# Patient Record
Sex: Female | Born: 1955 | Race: White | Hispanic: No | Marital: Single
Health system: Southern US, Community
[De-identification: ages and names within clinical notes are randomized; demographics above are authoritative.]

---

## 2014-06-30 ENCOUNTER — Inpatient Hospital Stay
Admission: AD | Admit: 2014-06-30 | Discharge: 2014-07-22 | Disposition: A | Discharge status: Skilled Nursing Facility | Payer: Self-pay | Source: Ambulatory Visit | Attending: Internal Medicine | Admitting: Internal Medicine

## 2014-06-30 ENCOUNTER — Other Ambulatory Visit (HOSPITAL_COMMUNITY): Payer: Self-pay

## 2014-07-01 ENCOUNTER — Other Ambulatory Visit (HOSPITAL_COMMUNITY): Payer: Self-pay

## 2014-07-01 LAB — CBC WITH DIFFERENTIAL/PLATELET
BASOS ABS: 0 10*3/uL (ref 0.0–0.1)
Basophils Relative: 0 % (ref 0–1)
Eosinophils Absolute: 0.5 10*3/uL (ref 0.0–0.7)
Eosinophils Relative: 5 % (ref 0–5)
HCT: 28.4 % — ABNORMAL LOW (ref 36.0–46.0)
Hemoglobin: 8.5 g/dL — ABNORMAL LOW (ref 12.0–15.0)
Lymphocytes Relative: 10 % — ABNORMAL LOW (ref 12–46)
Lymphs Abs: 1.2 10*3/uL (ref 0.7–4.0)
MCH: 29.2 pg (ref 26.0–34.0)
MCHC: 29.9 g/dL — ABNORMAL LOW (ref 30.0–36.0)
MCV: 97.6 fL (ref 78.0–100.0)
Monocytes Absolute: 0.6 10*3/uL (ref 0.1–1.0)
Monocytes Relative: 5 % (ref 3–12)
NEUTROS ABS: 9.1 10*3/uL — AB (ref 1.7–7.7)
Neutrophils Relative %: 80 % — ABNORMAL HIGH (ref 43–77)
Platelets: 429 10*3/uL — ABNORMAL HIGH (ref 150–400)
RBC: 2.91 MIL/uL — ABNORMAL LOW (ref 3.87–5.11)
RDW: 15.9 % — AB (ref 11.5–15.5)
WBC: 11.4 10*3/uL — AB (ref 4.0–10.5)

## 2014-07-01 LAB — PREALBUMIN: Prealbumin: 12.2 mg/dL — ABNORMAL LOW (ref 17.0–34.0)

## 2014-07-01 LAB — FERRITIN: Ferritin: 234 ng/mL (ref 10–291)

## 2014-07-01 LAB — COMPREHENSIVE METABOLIC PANEL
ALT: 17 U/L (ref 0–35)
AST: 20 U/L (ref 0–37)
Albumin: 2.2 g/dL — ABNORMAL LOW (ref 3.5–5.2)
Alkaline Phosphatase: 152 U/L — ABNORMAL HIGH (ref 39–117)
Anion gap: 13 (ref 5–15)
BILIRUBIN TOTAL: 0.3 mg/dL (ref 0.3–1.2)
BUN: 16 mg/dL (ref 6–23)
CO2: 27 mEq/L (ref 19–32)
Calcium: 8.6 mg/dL (ref 8.4–10.5)
Chloride: 101 mEq/L (ref 96–112)
Creatinine, Ser: 0.27 mg/dL — ABNORMAL LOW (ref 0.50–1.10)
GFR calc Af Amer: >90 mL/min (ref >90)
Glucose, Bld: 114 mg/dL — ABNORMAL HIGH (ref 70–99)
Potassium: 3.9 mEq/L (ref 3.7–5.3)
Sodium: 141 mEq/L (ref 137–147)
Total Protein: 7.2 g/dL (ref 6.0–8.3)

## 2014-07-01 LAB — PROCALCITONIN: Procalcitonin: <0.1 ng/mL

## 2014-07-01 LAB — TSH: TSH: 3.1 u[IU]/mL (ref 0.350–4.500)

## 2014-07-01 LAB — LIPID PANEL
CHOL/HDL RATIO: 4.8 ratio
CHOLESTEROL: 140 mg/dL (ref 0–200)
HDL: 29 mg/dL — ABNORMAL LOW (ref >39)
LDL Cholesterol: 89 mg/dL (ref 0–99)
Triglycerides: 110 mg/dL (ref <150)
VLDL: 22 mg/dL (ref 0–40)

## 2014-07-01 LAB — VANCOMYCIN, TROUGH: Vancomycin Tr: 6.5 ug/mL — ABNORMAL LOW (ref 10.0–20.0)

## 2014-07-01 LAB — PHOSPHORUS: Phosphorus: 3.4 mg/dL (ref 2.3–4.6)

## 2014-07-01 LAB — MAGNESIUM: Magnesium: 1.9 mg/dL (ref 1.5–2.5)

## 2014-07-01 LAB — PROTIME-INR
INR: 1.09 (ref 0.00–1.49)
Prothrombin Time: 14.1 seconds (ref 11.6–15.2)

## 2014-07-01 LAB — HEMOGLOBIN A1C
HEMOGLOBIN A1C: 4.7 % (ref <5.7)
Mean Plasma Glucose: 88 mg/dL (ref <117)

## 2014-07-01 LAB — VITAMIN B12: Vitamin B-12: 438 pg/mL (ref 211–911)

## 2014-07-01 LAB — T4, FREE: FREE T4: 1.14 ng/dL (ref 0.80–1.80)

## 2014-07-01 MED ORDER — IOHEXOL 300 MG/ML  SOLN
100.0000 mL | Freq: Once | INTRAMUSCULAR | Status: AC | PRN
  Administered 2014-07-01: 100 mL via INTRAVENOUS

## 2014-07-01 MED ORDER — IOHEXOL 300 MG/ML  SOLN: 50.0000 mL | INTRAMUSCULAR | Status: AC

## 2014-07-02 LAB — BASIC METABOLIC PANEL
Anion gap: 15 (ref 5–15)
BUN: 13 mg/dL (ref 6–23)
CALCIUM: 9.2 mg/dL (ref 8.4–10.5)
CO2: 27 mEq/L (ref 19–32)
Chloride: 99 mEq/L (ref 96–112)
Creatinine, Ser: 0.3 mg/dL — ABNORMAL LOW (ref 0.50–1.10)
GFR calc Af Amer: >90 mL/min (ref >90)
Glucose, Bld: 108 mg/dL — ABNORMAL HIGH (ref 70–99)
Potassium: 4.1 mEq/L (ref 3.7–5.3)
SODIUM: 141 meq/L (ref 137–147)

## 2014-07-02 LAB — CBC WITH DIFFERENTIAL/PLATELET
Basophils Absolute: 0.1 10*3/uL (ref 0.0–0.1)
Basophils Relative: 0 % (ref 0–1)
EOS ABS: 0.4 10*3/uL (ref 0.0–0.7)
EOS PCT: 4 % (ref 0–5)
HCT: 32.4 % — ABNORMAL LOW (ref 36.0–46.0)
Hemoglobin: 10 g/dL — ABNORMAL LOW (ref 12.0–15.0)
Lymphocytes Relative: 14 % (ref 12–46)
Lymphs Abs: 1.6 10*3/uL (ref 0.7–4.0)
MCH: 29.6 pg (ref 26.0–34.0)
MCHC: 30.9 g/dL (ref 30.0–36.0)
MCV: 95.9 fL (ref 78.0–100.0)
MONOS PCT: 8 % (ref 3–12)
Monocytes Absolute: 0.9 10*3/uL (ref 0.1–1.0)
NEUTROS PCT: 74 % (ref 43–77)
Neutro Abs: 8.5 10*3/uL — ABNORMAL HIGH (ref 1.7–7.7)
Platelets: 444 10*3/uL — ABNORMAL HIGH (ref 150–400)
RBC: 3.38 MIL/uL — ABNORMAL LOW (ref 3.87–5.11)
RDW: 15.9 % — ABNORMAL HIGH (ref 11.5–15.5)
WBC: 11.4 10*3/uL — ABNORMAL HIGH (ref 4.0–10.5)

## 2014-07-02 LAB — FOLATE RBC: RBC Folate: 787 ng/mL — ABNORMAL HIGH (ref >280)

## 2014-07-04 ENCOUNTER — Other Ambulatory Visit (HOSPITAL_COMMUNITY): Payer: Self-pay

## 2014-07-04 LAB — URINE CULTURE: Colony Count: >=100000

## 2014-07-04 LAB — BASIC METABOLIC PANEL
Anion gap: 12 (ref 5–15)
BUN: 15 mg/dL (ref 6–23)
CHLORIDE: 99 meq/L (ref 96–112)
CO2: 26 meq/L (ref 19–32)
CREATININE: 0.27 mg/dL — AB (ref 0.50–1.10)
Calcium: 9.1 mg/dL (ref 8.4–10.5)
GFR calc Af Amer: >90 mL/min (ref >90)
GFR calc non Af Amer: >90 mL/min (ref >90)
Glucose, Bld: 104 mg/dL — ABNORMAL HIGH (ref 70–99)
POTASSIUM: 4.5 meq/L (ref 3.7–5.3)
Sodium: 137 mEq/L (ref 137–147)

## 2014-07-04 LAB — COMPREHENSIVE METABOLIC PANEL
ALBUMIN: 2.3 g/dL — AB (ref 3.5–5.2)
ALT: 16 U/L (ref 0–35)
AST: 17 U/L (ref 0–37)
Alkaline Phosphatase: 134 U/L — ABNORMAL HIGH (ref 39–117)
Anion gap: 7 (ref 5–15)
BUN: 15 mg/dL (ref 6–23)
CO2: 24 meq/L (ref 19–32)
CREATININE: 0.23 mg/dL — AB (ref 0.50–1.10)
Calcium: 8.1 mg/dL — ABNORMAL LOW (ref 8.4–10.5)
Chloride: 97 mEq/L (ref 96–112)
GFR calc Af Amer: >90 mL/min (ref >90)
Glucose, Bld: 400 mg/dL — ABNORMAL HIGH (ref 70–99)
Potassium: 3.8 mEq/L (ref 3.7–5.3)
Sodium: 128 mEq/L — ABNORMAL LOW (ref 137–147)
TOTAL PROTEIN: 7.4 g/dL (ref 6.0–8.3)
Total Bilirubin: 0.2 mg/dL — ABNORMAL LOW (ref 0.3–1.2)

## 2014-07-04 LAB — CBC WITH DIFFERENTIAL/PLATELET
BASOS ABS: 0 10*3/uL (ref 0.0–0.1)
BASOS PCT: 0 % (ref 0–1)
EOS ABS: 0.5 10*3/uL (ref 0.0–0.7)
EOS PCT: 5 % (ref 0–5)
HCT: 26.9 % — ABNORMAL LOW (ref 36.0–46.0)
Hemoglobin: 8 g/dL — ABNORMAL LOW (ref 12.0–15.0)
LYMPHS ABS: 1.7 10*3/uL (ref 0.7–4.0)
Lymphocytes Relative: 17 % (ref 12–46)
MCH: 29.3 pg (ref 26.0–34.0)
MCHC: 29.7 g/dL — ABNORMAL LOW (ref 30.0–36.0)
MCV: 98.5 fL (ref 78.0–100.0)
Monocytes Absolute: 0.7 10*3/uL (ref 0.1–1.0)
Monocytes Relative: 7 % (ref 3–12)
NEUTROS PCT: 71 % (ref 43–77)
Neutro Abs: 7.1 10*3/uL (ref 1.7–7.7)
PLATELETS: 350 10*3/uL (ref 150–400)
RBC: 2.73 MIL/uL — ABNORMAL LOW (ref 3.87–5.11)
RDW: 16.1 % — AB (ref 11.5–15.5)
WBC: 10 10*3/uL (ref 4.0–10.5)

## 2014-07-04 LAB — BODY FLUID CELL COUNT WITH DIFFERENTIAL
Eos, Fluid: 1 %
LYMPHS FL: 93 %
Monocyte-Macrophage-Serous Fluid: 2 % — ABNORMAL LOW (ref 50–90)
NEUTROPHIL FLUID: 4 % (ref 0–25)
WBC FLUID: 1644 uL — AB (ref 0–1000)

## 2014-07-04 LAB — PREALBUMIN: Prealbumin: 15.9 mg/dL — ABNORMAL LOW (ref 17.0–34.0)

## 2014-07-04 LAB — LACTATE DEHYDROGENASE, PLEURAL OR PERITONEAL FLUID: LD FL: 223 U/L — AB (ref 3–23)

## 2014-07-04 NOTE — Procedures (Addendum)
US guided L thora 600 cc blood tinged fluid Sent for labs  cxr pending

## 2014-07-05 ENCOUNTER — Other Ambulatory Visit (HOSPITAL_COMMUNITY): Payer: Self-pay

## 2014-07-06 LAB — BASIC METABOLIC PANEL
Anion gap: 18 — ABNORMAL HIGH (ref 5–15)
BUN: 18 mg/dL (ref 6–23)
CHLORIDE: 96 meq/L (ref 96–112)
CO2: 22 mEq/L (ref 19–32)
CREATININE: 0.34 mg/dL — AB (ref 0.50–1.10)
Calcium: 9.5 mg/dL (ref 8.4–10.5)
GFR calc Af Amer: >90 mL/min (ref >90)
GFR calc non Af Amer: >90 mL/min (ref >90)
GLUCOSE: 118 mg/dL — AB (ref 70–99)
Potassium: 4.2 mEq/L (ref 3.7–5.3)
Sodium: 136 mEq/L — ABNORMAL LOW (ref 137–147)

## 2014-07-06 LAB — CBC
HEMATOCRIT: 33.2 % — AB (ref 36.0–46.0)
HEMOGLOBIN: 10.4 g/dL — AB (ref 12.0–15.0)
MCH: 29.5 pg (ref 26.0–34.0)
MCHC: 31.3 g/dL (ref 30.0–36.0)
MCV: 94.3 fL (ref 78.0–100.0)
Platelets: 474 10*3/uL — ABNORMAL HIGH (ref 150–400)
RBC: 3.52 MIL/uL — ABNORMAL LOW (ref 3.87–5.11)
RDW: 15.5 % (ref 11.5–15.5)
WBC: 13.5 10*3/uL — AB (ref 4.0–10.5)

## 2014-07-06 LAB — CLOSTRIDIUM DIFFICILE BY PCR: Toxigenic C. Difficile by PCR: NEGATIVE

## 2014-07-07 LAB — CBC WITH DIFFERENTIAL/PLATELET
Basophils Absolute: 0.1 10*3/uL (ref 0.0–0.1)
Basophils Relative: 1 % (ref 0–1)
EOS ABS: 0.6 10*3/uL (ref 0.0–0.7)
Eosinophils Relative: 4 % (ref 0–5)
HEMATOCRIT: 35.3 % — AB (ref 36.0–46.0)
Hemoglobin: 10.8 g/dL — ABNORMAL LOW (ref 12.0–15.0)
LYMPHS ABS: 3 10*3/uL (ref 0.7–4.0)
LYMPHS PCT: 21 % (ref 12–46)
MCH: 28.8 pg (ref 26.0–34.0)
MCHC: 30.6 g/dL (ref 30.0–36.0)
MCV: 94.1 fL (ref 78.0–100.0)
MONO ABS: 1.2 10*3/uL — AB (ref 0.1–1.0)
MONOS PCT: 8 % (ref 3–12)
Neutro Abs: 9.7 10*3/uL — ABNORMAL HIGH (ref 1.7–7.7)
Neutrophils Relative %: 66 % (ref 43–77)
PLATELETS: 467 10*3/uL — AB (ref 150–400)
RBC: 3.75 MIL/uL — AB (ref 3.87–5.11)
RDW: 15.3 % (ref 11.5–15.5)
WBC: 14.5 10*3/uL — AB (ref 4.0–10.5)

## 2014-07-08 LAB — BASIC METABOLIC PANEL
ANION GAP: 15 (ref 5–15)
BUN: 24 mg/dL — AB (ref 6–23)
CALCIUM: 9.8 mg/dL (ref 8.4–10.5)
CO2: 24 mEq/L (ref 19–32)
CREATININE: 0.39 mg/dL — AB (ref 0.50–1.10)
Chloride: 98 mEq/L (ref 96–112)
GFR calc non Af Amer: >90 mL/min (ref >90)
Glucose, Bld: 92 mg/dL (ref 70–99)
Potassium: 4.8 mEq/L (ref 3.7–5.3)
Sodium: 137 mEq/L (ref 137–147)

## 2014-07-08 LAB — CBC
HEMATOCRIT: 41.3 % (ref 36.0–46.0)
Hemoglobin: 12.9 g/dL (ref 12.0–15.0)
MCH: 30 pg (ref 26.0–34.0)
MCHC: 31.2 g/dL (ref 30.0–36.0)
MCV: 96 fL (ref 78.0–100.0)
PLATELETS: 471 10*3/uL — AB (ref 150–400)
RBC: 4.3 MIL/uL (ref 3.87–5.11)
RDW: 15.4 % (ref 11.5–15.5)
WBC: 13.4 10*3/uL — AB (ref 4.0–10.5)

## 2014-07-08 LAB — PROCALCITONIN: Procalcitonin: 0.11 ng/mL

## 2014-07-10 ENCOUNTER — Other Ambulatory Visit (HOSPITAL_COMMUNITY): Payer: Self-pay

## 2014-07-10 LAB — BASIC METABOLIC PANEL
Anion gap: 17 — ABNORMAL HIGH (ref 5–15)
BUN: 28 mg/dL — AB (ref 6–23)
CALCIUM: 9.5 mg/dL (ref 8.4–10.5)
CO2: 21 mEq/L (ref 19–32)
Chloride: 97 mEq/L (ref 96–112)
Creatinine, Ser: 0.27 mg/dL — ABNORMAL LOW (ref 0.50–1.10)
GFR calc non Af Amer: >90 mL/min (ref >90)
GLUCOSE: 130 mg/dL — AB (ref 70–99)
POTASSIUM: 5.3 meq/L (ref 3.7–5.3)
SODIUM: 135 meq/L — AB (ref 137–147)

## 2014-07-10 LAB — CBC WITH DIFFERENTIAL/PLATELET
Basophils Absolute: 0.1 10*3/uL (ref 0.0–0.1)
Basophils Relative: 0 % (ref 0–1)
EOS ABS: 0.4 10*3/uL (ref 0.0–0.7)
EOS PCT: 2 % (ref 0–5)
HCT: 34.7 % — ABNORMAL LOW (ref 36.0–46.0)
Hemoglobin: 10.9 g/dL — ABNORMAL LOW (ref 12.0–15.0)
LYMPHS ABS: 2.7 10*3/uL (ref 0.7–4.0)
Lymphocytes Relative: 19 % (ref 12–46)
MCH: 28.8 pg (ref 26.0–34.0)
MCHC: 31.4 g/dL (ref 30.0–36.0)
MCV: 91.8 fL (ref 78.0–100.0)
MONOS PCT: 9 % (ref 3–12)
Monocytes Absolute: 1.3 10*3/uL — ABNORMAL HIGH (ref 0.1–1.0)
NEUTROS PCT: 70 % (ref 43–77)
Neutro Abs: 10 10*3/uL — ABNORMAL HIGH (ref 1.7–7.7)
PLATELETS: 494 10*3/uL — AB (ref 150–400)
RBC: 3.78 MIL/uL — ABNORMAL LOW (ref 3.87–5.11)
RDW: 14.9 % (ref 11.5–15.5)
WBC: 14.4 10*3/uL — ABNORMAL HIGH (ref 4.0–10.5)

## 2014-07-10 LAB — PHOSPHORUS: Phosphorus: 3.9 mg/dL (ref 2.3–4.6)

## 2014-07-10 LAB — MAGNESIUM: Magnesium: 1.9 mg/dL (ref 1.5–2.5)

## 2014-07-11 LAB — COMPREHENSIVE METABOLIC PANEL
ALBUMIN: 3 g/dL — AB (ref 3.5–5.2)
ALT: 22 U/L (ref 0–35)
AST: 21 U/L (ref 0–37)
Alkaline Phosphatase: 134 U/L — ABNORMAL HIGH (ref 39–117)
Anion gap: 16 — ABNORMAL HIGH (ref 5–15)
BILIRUBIN TOTAL: 0.2 mg/dL — AB (ref 0.3–1.2)
BUN: 25 mg/dL — AB (ref 6–23)
CALCIUM: 9.6 mg/dL (ref 8.4–10.5)
CO2: 22 mEq/L (ref 19–32)
CREATININE: 0.32 mg/dL — AB (ref 0.50–1.10)
Chloride: 98 mEq/L (ref 96–112)
GFR calc Af Amer: >90 mL/min (ref >90)
GFR calc non Af Amer: >90 mL/min (ref >90)
Glucose, Bld: 117 mg/dL — ABNORMAL HIGH (ref 70–99)
Potassium: 4.5 mEq/L (ref 3.7–5.3)
Sodium: 136 mEq/L — ABNORMAL LOW (ref 137–147)
Total Protein: 8.2 g/dL (ref 6.0–8.3)

## 2014-07-11 LAB — CBC WITH DIFFERENTIAL/PLATELET
BASOS ABS: 0.1 10*3/uL (ref 0.0–0.1)
Basophils Relative: 0 % (ref 0–1)
EOS ABS: 0.5 10*3/uL (ref 0.0–0.7)
EOS PCT: 4 % (ref 0–5)
HEMATOCRIT: 33.8 % — AB (ref 36.0–46.0)
HEMOGLOBIN: 10.8 g/dL — AB (ref 12.0–15.0)
Lymphocytes Relative: 24 % (ref 12–46)
Lymphs Abs: 3 10*3/uL (ref 0.7–4.0)
MCH: 29.3 pg (ref 26.0–34.0)
MCHC: 32 g/dL (ref 30.0–36.0)
MCV: 91.6 fL (ref 78.0–100.0)
MONOS PCT: 10 % (ref 3–12)
Monocytes Absolute: 1.2 10*3/uL — ABNORMAL HIGH (ref 0.1–1.0)
Neutro Abs: 7.7 10*3/uL (ref 1.7–7.7)
Neutrophils Relative %: 62 % (ref 43–77)
Platelets: 500 10*3/uL — ABNORMAL HIGH (ref 150–400)
RBC: 3.69 MIL/uL — ABNORMAL LOW (ref 3.87–5.11)
RDW: 14.7 % (ref 11.5–15.5)
WBC: 12.5 10*3/uL — ABNORMAL HIGH (ref 4.0–10.5)

## 2014-07-11 LAB — PREALBUMIN: PREALBUMIN: 23.4 mg/dL (ref 17.0–34.0)

## 2014-07-13 LAB — CBC WITH DIFFERENTIAL/PLATELET
BASOS ABS: 0.1 10*3/uL (ref 0.0–0.1)
Basophils Relative: 1 % (ref 0–1)
Eosinophils Absolute: 0.3 10*3/uL (ref 0.0–0.7)
Eosinophils Relative: 3 % (ref 0–5)
HCT: 38.5 % (ref 36.0–46.0)
HEMOGLOBIN: 12.4 g/dL (ref 12.0–15.0)
LYMPHS PCT: 31 % (ref 12–46)
Lymphs Abs: 2.9 10*3/uL (ref 0.7–4.0)
MCH: 29.7 pg (ref 26.0–34.0)
MCHC: 32.2 g/dL (ref 30.0–36.0)
MCV: 92.1 fL (ref 78.0–100.0)
MONOS PCT: 8 % (ref 3–12)
Monocytes Absolute: 0.7 10*3/uL (ref 0.1–1.0)
NEUTROS ABS: 5.3 10*3/uL (ref 1.7–7.7)
NEUTROS PCT: 57 % (ref 43–77)
Platelets: 434 10*3/uL — ABNORMAL HIGH (ref 150–400)
RBC: 4.18 MIL/uL (ref 3.87–5.11)
RDW: 14.6 % (ref 11.5–15.5)
WBC: 9.3 10*3/uL (ref 4.0–10.5)

## 2014-07-13 LAB — BASIC METABOLIC PANEL
ANION GAP: 16 — AB (ref 5–15)
BUN: 19 mg/dL (ref 6–23)
CHLORIDE: 98 meq/L (ref 96–112)
CO2: 22 mEq/L (ref 19–32)
Calcium: 10.1 mg/dL (ref 8.4–10.5)
Creatinine, Ser: 0.4 mg/dL — ABNORMAL LOW (ref 0.50–1.10)
GFR calc non Af Amer: >90 mL/min (ref >90)
Glucose, Bld: 100 mg/dL — ABNORMAL HIGH (ref 70–99)
Potassium: 3.8 mEq/L (ref 3.7–5.3)
Sodium: 136 mEq/L — ABNORMAL LOW (ref 137–147)

## 2014-07-13 LAB — PHOSPHORUS: Phosphorus: 4.5 mg/dL (ref 2.3–4.6)

## 2014-07-13 LAB — MAGNESIUM: Magnesium: 1.9 mg/dL (ref 1.5–2.5)

## 2014-07-14 ENCOUNTER — Other Ambulatory Visit (HOSPITAL_COMMUNITY): Payer: Self-pay

## 2014-07-15 LAB — CBC WITH DIFFERENTIAL/PLATELET
BASOS ABS: 0 10*3/uL (ref 0.0–0.1)
BASOS PCT: 0 % (ref 0–1)
Eosinophils Absolute: 0.2 10*3/uL (ref 0.0–0.7)
Eosinophils Relative: 2 % (ref 0–5)
HEMATOCRIT: 35.3 % — AB (ref 36.0–46.0)
HEMOGLOBIN: 11.1 g/dL — AB (ref 12.0–15.0)
Lymphocytes Relative: 30 % (ref 12–46)
Lymphs Abs: 2.8 10*3/uL (ref 0.7–4.0)
MCH: 28.5 pg (ref 26.0–34.0)
MCHC: 31.4 g/dL (ref 30.0–36.0)
MCV: 90.7 fL (ref 78.0–100.0)
MONO ABS: 0.9 10*3/uL (ref 0.1–1.0)
MONOS PCT: 10 % (ref 3–12)
NEUTROS PCT: 58 % (ref 43–77)
Neutro Abs: 5.4 10*3/uL (ref 1.7–7.7)
Platelets: 441 10*3/uL — ABNORMAL HIGH (ref 150–400)
RBC: 3.89 MIL/uL (ref 3.87–5.11)
RDW: 14.5 % (ref 11.5–15.5)
WBC: 9.2 10*3/uL (ref 4.0–10.5)

## 2014-07-15 LAB — BASIC METABOLIC PANEL
ANION GAP: 15 (ref 5–15)
BUN: 18 mg/dL (ref 6–23)
CO2: 24 meq/L (ref 19–32)
Calcium: 9.6 mg/dL (ref 8.4–10.5)
Chloride: 97 mEq/L (ref 96–112)
Creatinine, Ser: 0.5 mg/dL (ref 0.50–1.10)
GFR calc non Af Amer: >90 mL/min (ref >90)
Glucose, Bld: 99 mg/dL (ref 70–99)
POTASSIUM: 3.8 meq/L (ref 3.7–5.3)
Sodium: 136 mEq/L — ABNORMAL LOW (ref 137–147)

## 2014-07-15 LAB — PHOSPHORUS: PHOSPHORUS: 4.2 mg/dL (ref 2.3–4.6)

## 2014-07-15 LAB — MAGNESIUM: Magnesium: 1.8 mg/dL (ref 1.5–2.5)

## 2014-07-16 LAB — BASIC METABOLIC PANEL
ANION GAP: 16 — AB (ref 5–15)
BUN: 13 mg/dL (ref 6–23)
CALCIUM: 9.4 mg/dL (ref 8.4–10.5)
CO2: 21 mEq/L (ref 19–32)
Chloride: 101 mEq/L (ref 96–112)
Creatinine, Ser: 0.4 mg/dL — ABNORMAL LOW (ref 0.50–1.10)
GFR calc non Af Amer: >90 mL/min (ref >90)
Glucose, Bld: 91 mg/dL (ref 70–99)
POTASSIUM: 3.9 meq/L (ref 3.7–5.3)
SODIUM: 138 meq/L (ref 137–147)

## 2014-07-18 LAB — BASIC METABOLIC PANEL
ANION GAP: 12 (ref 5–15)
BUN: 17 mg/dL (ref 6–23)
CHLORIDE: 98 meq/L (ref 96–112)
CO2: 25 mEq/L (ref 19–32)
Calcium: 9.7 mg/dL (ref 8.4–10.5)
Creatinine, Ser: 0.5 mg/dL (ref 0.50–1.10)
GFR calc non Af Amer: >90 mL/min (ref >90)
Glucose, Bld: 97 mg/dL (ref 70–99)
Potassium: 3.9 mEq/L (ref 3.7–5.3)
SODIUM: 135 meq/L — AB (ref 137–147)

## 2014-07-18 LAB — CBC
HCT: 35.9 % — ABNORMAL LOW (ref 36.0–46.0)
Hemoglobin: 11.5 g/dL — ABNORMAL LOW (ref 12.0–15.0)
MCH: 28.8 pg (ref 26.0–34.0)
MCHC: 32 g/dL (ref 30.0–36.0)
MCV: 90 fL (ref 78.0–100.0)
PLATELETS: 401 10*3/uL — AB (ref 150–400)
RBC: 3.99 MIL/uL (ref 3.87–5.11)
RDW: 14.3 % (ref 11.5–15.5)
WBC: 7.7 10*3/uL (ref 4.0–10.5)

## 2014-07-18 LAB — PREALBUMIN: PREALBUMIN: 18.2 mg/dL (ref 17.0–34.0)

## 2014-07-19 LAB — PREALBUMIN: Prealbumin: 16.8 mg/dL — ABNORMAL LOW (ref 17.0–34.0)

## 2014-07-20 ENCOUNTER — Other Ambulatory Visit (HOSPITAL_COMMUNITY): Payer: Medicare Other

## 2014-07-21 LAB — CBC
HCT: 35.5 % — ABNORMAL LOW (ref 36.0–46.0)
Hemoglobin: 11.4 g/dL — ABNORMAL LOW (ref 12.0–15.0)
MCH: 28.9 pg (ref 26.0–34.0)
MCHC: 32.1 g/dL (ref 30.0–36.0)
MCV: 89.9 fL (ref 78.0–100.0)
PLATELETS: 376 10*3/uL (ref 150–400)
RBC: 3.95 MIL/uL (ref 3.87–5.11)
RDW: 14.1 % (ref 11.5–15.5)
WBC: 7.5 10*3/uL (ref 4.0–10.5)

## 2014-07-21 LAB — URINALYSIS, ROUTINE W REFLEX MICROSCOPIC
Glucose, UA: NEGATIVE mg/dL
Hgb urine dipstick: NEGATIVE
KETONES UR: NEGATIVE mg/dL
NITRITE: NEGATIVE
Protein, ur: NEGATIVE mg/dL
Specific Gravity, Urine: 1.025 (ref 1.005–1.030)
Urobilinogen, UA: 1 mg/dL (ref 0.0–1.0)
pH: 6 (ref 5.0–8.0)

## 2014-07-21 LAB — URINE MICROSCOPIC-ADD ON

## 2014-07-21 LAB — BASIC METABOLIC PANEL
Anion gap: 16 — ABNORMAL HIGH (ref 5–15)
BUN: 16 mg/dL (ref 6–23)
CALCIUM: 9.4 mg/dL (ref 8.4–10.5)
CO2: 21 mEq/L (ref 19–32)
Chloride: 102 mEq/L (ref 96–112)
Creatinine, Ser: 0.48 mg/dL — ABNORMAL LOW (ref 0.50–1.10)
GFR calc non Af Amer: >90 mL/min (ref >90)
GLUCOSE: 124 mg/dL — AB (ref 70–99)
Potassium: 3.5 mEq/L — ABNORMAL LOW (ref 3.7–5.3)
SODIUM: 139 meq/L (ref 137–147)

## 2014-07-24 LAB — URINE CULTURE: Colony Count: >=100000
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
## 2015-07-20 IMAGING — CR DG CHEST 1V PORT
1 series · 1 of 1 positions shown · non-contrast
Comparison: Portable chest x-ray of today's date at [DATE] a.m.

CLINICAL DATA: Post thoracentesis image

EXAM:
PORTABLE CHEST - 1 VIEW

[AP]
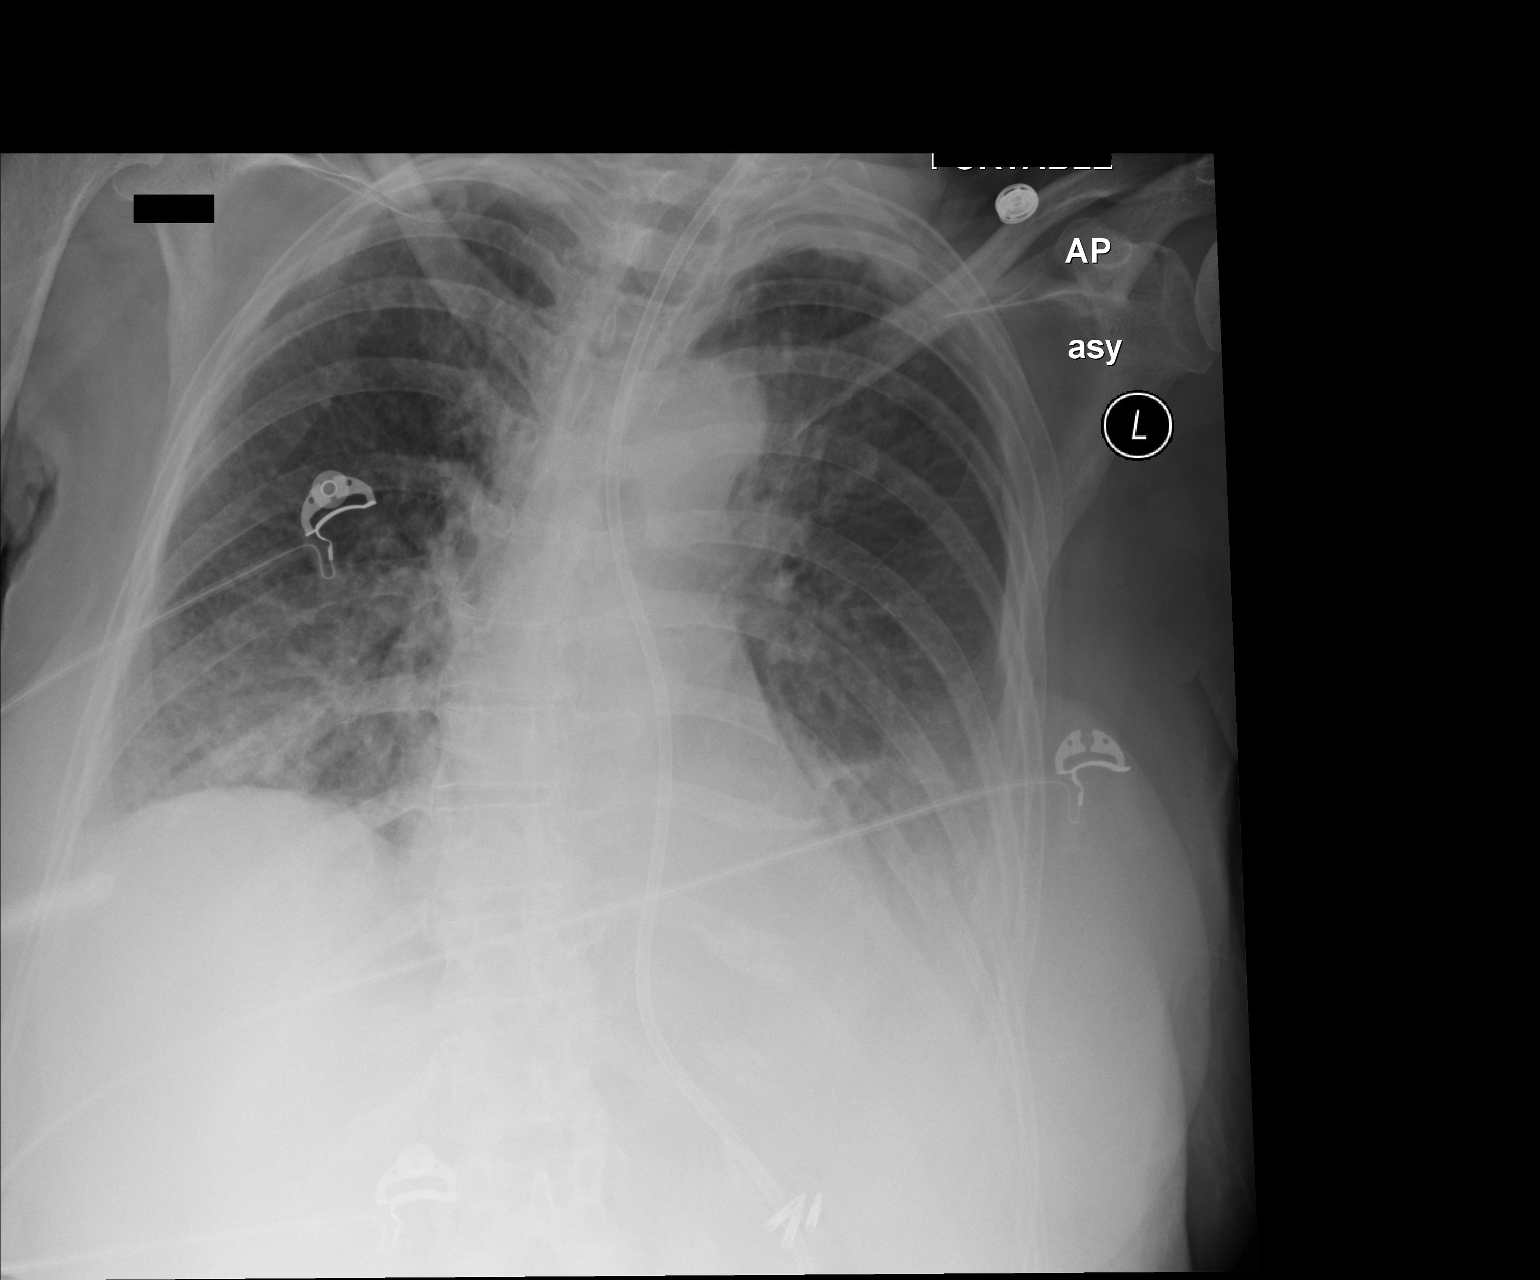

[1 of 1 positions shown; findings below may reference images not displayed]

FINDINGS: The volume of pleural fluid on the left has decreased. There remains
a moderate-sized left effusion and basilar atelectasis. The left
hemidiaphragm remains obscured. On the right there is persistent
increased density in the infrahilar region. The heart is not
enlarged. The pulmonary vascularity is normal. A feeding tube is in
place whose tip projects below the inferior margin of the image.
IMPRESSION: There is no postprocedure pneumothorax following left-sided
thoracentesis.

These results were called by telephone at the time of interpretation
on 07/04/2014 at [DATE] to DUBWOLF TINAA, PA,, who verbally
acknowledged these results.

## 2015-07-20 IMAGING — US US THORACENTESIS ASP PLEURAL SPACE W/IMG GUIDE
1 series · 8 of 8 positions shown · non-contrast
Comparison: None.

CLINICAL DATA: Left pleural effusion

EXAM:
ULTRASOUND GUIDED left THORACENTESIS

[Series 1: us thoracentesis asp pleural space w/img guide · 0.24mm/px · 8 of 8 slices shown]
[im 1/8]
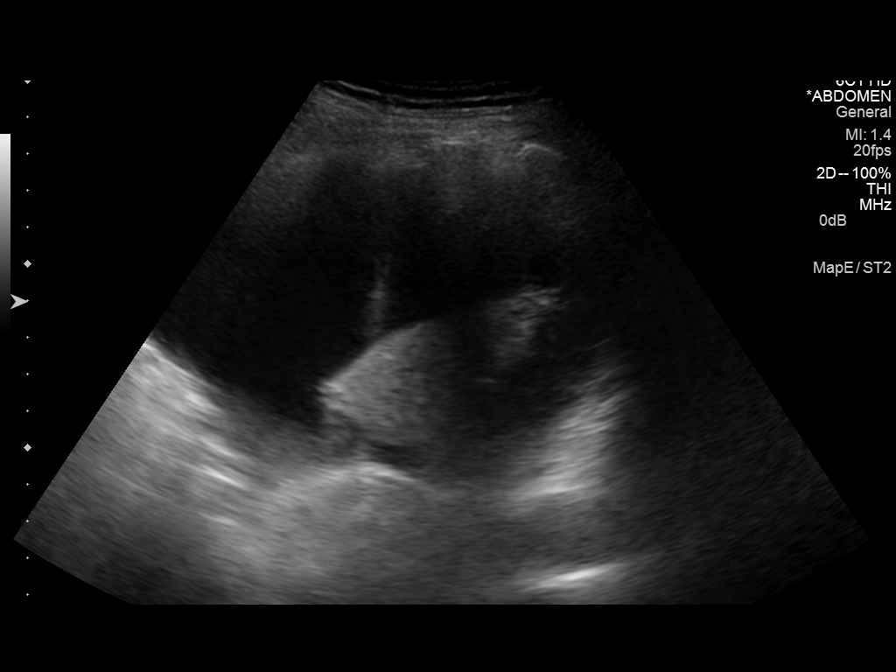
[im 2/8]
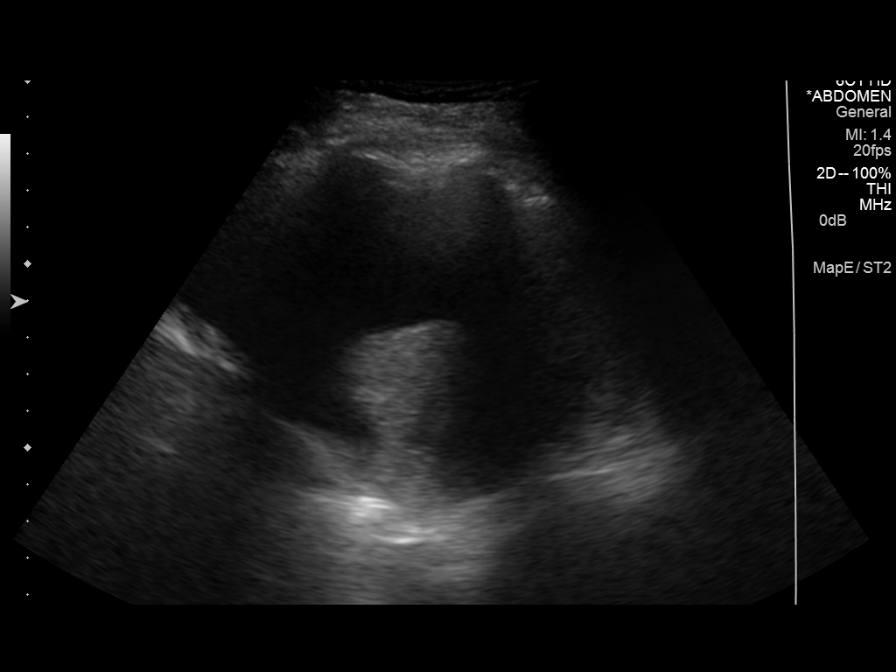
[im 3/8]
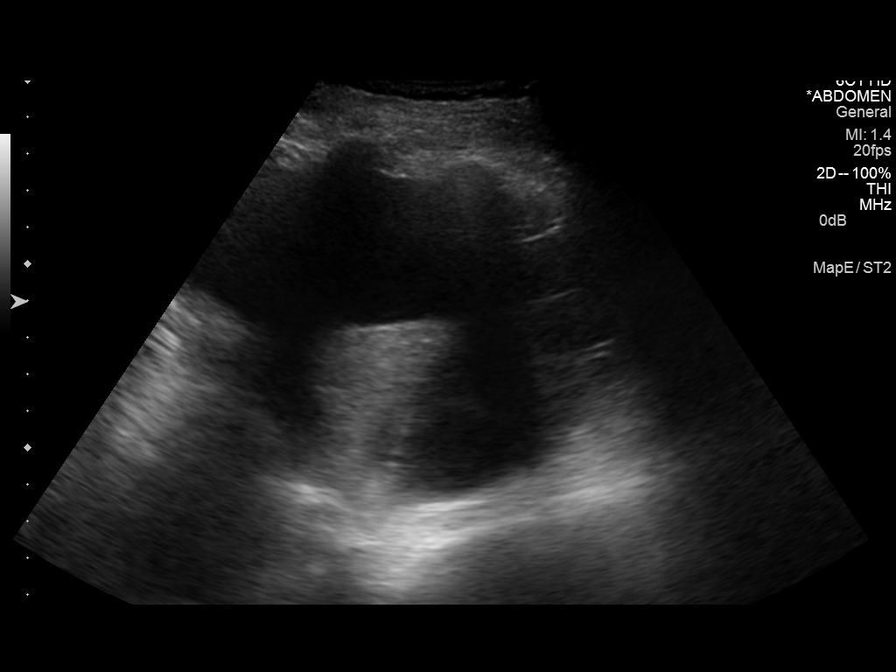
[im 4/8]
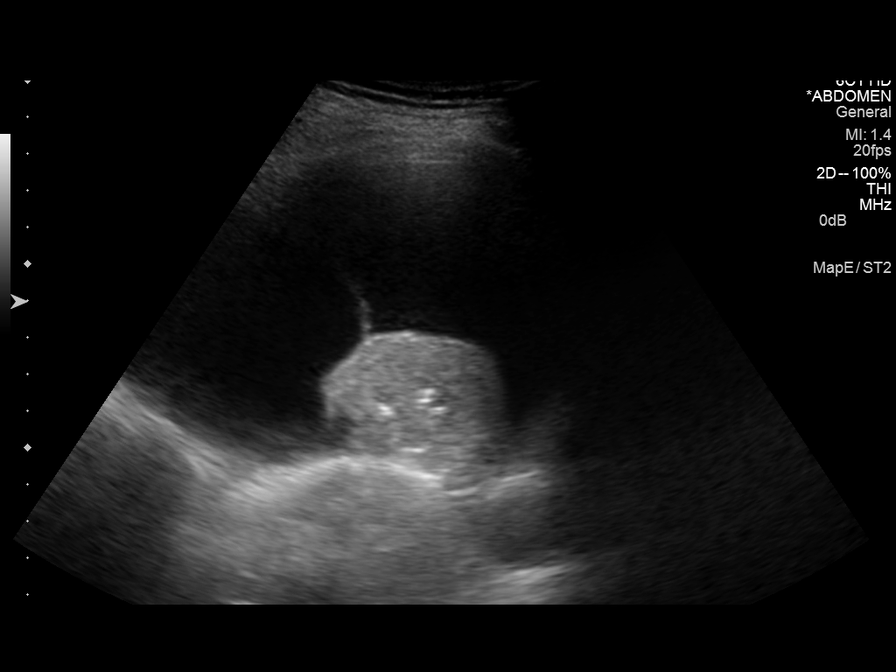
[im 5/8]
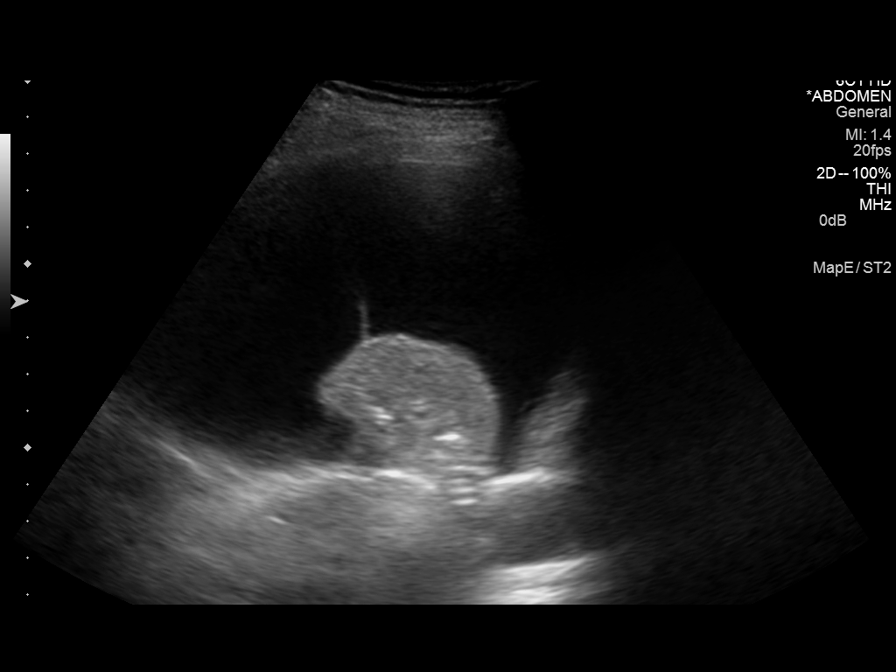
[im 6/8]
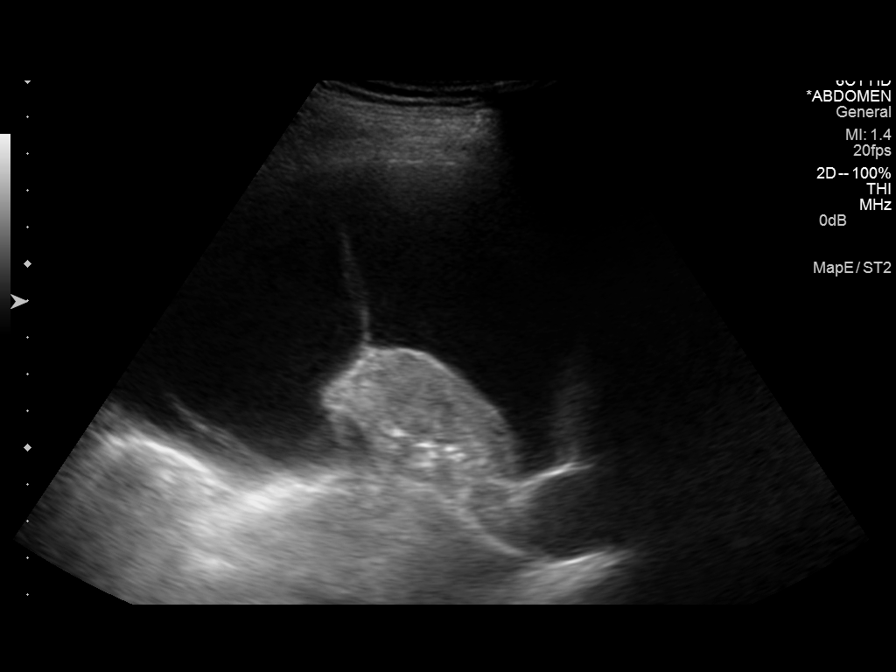
[im 7/8]
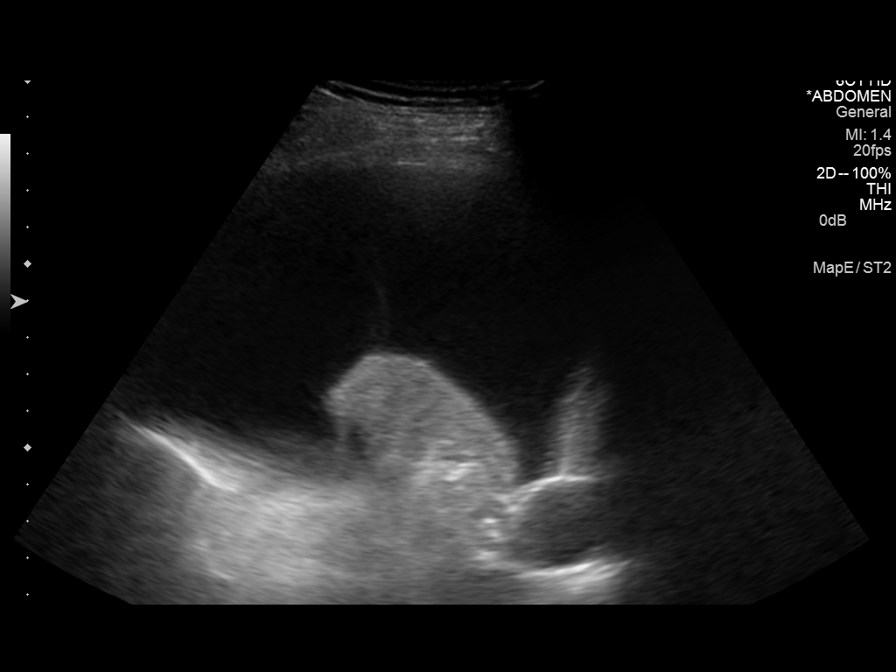
[im 8/8]
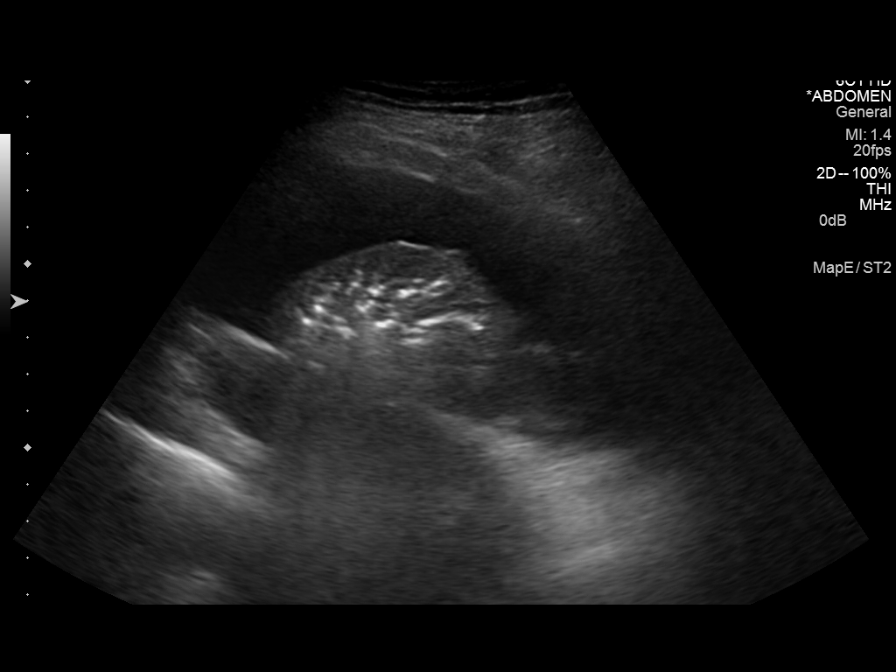

[8 of 8 positions shown; findings below may reference images not displayed]

PROCEDURE:
An ultrasound guided thoracentesis was thoroughly discussed with the
patient and questions answered. The benefits, risks, alternatives
and complications were also discussed. The patient understands and
wishes to proceed with the procedure. Written consent was obtained.

Ultrasound was performed to localize and mark an adequate pocket of
fluid in the left chest. The area was then prepped and draped in the
normal sterile fashion. 1% Lidocaine was used for local anesthesia.
Under ultrasound guidance a 19 gauge Yueh catheter was introduced.
Thoracentesis was performed. The catheter was removed and a dressing
applied.

Complications:  None
FINDINGS: A total of approximately 600 cc of blood tinged fluid was removed. A
fluid sample wassent for laboratory analysis.
IMPRESSION: Successful ultrasound guided left thoracentesis yielding 600 cc of
pleural fluid.

Read by: Ingunn Harpa Ronlor
